# Patient Record
Sex: Male | Born: 1960 | Race: White | Hispanic: No | Marital: Married | Smoking: Never smoker
Health system: Southern US, Community
[De-identification: ages and names within clinical notes are randomized; demographics above are authoritative.]

## PROBLEM LIST (undated history)

## (undated) DIAGNOSIS — L719 Rosacea, unspecified: Secondary | ICD-10-CM

## (undated) DIAGNOSIS — Z973 Presence of spectacles and contact lenses: Secondary | ICD-10-CM

---
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
## 1982-06-03 HISTORY — PX: WISDOM TOOTH EXTRACTION: SHX21

## 2000-12-05 ENCOUNTER — Encounter: Payer: Self-pay | Admitting: Family Medicine

## 2000-12-05 ENCOUNTER — Ambulatory Visit (HOSPITAL_COMMUNITY): Admission: RE | Admit: 2000-12-05 | Discharge: 2000-12-05 | Payer: Self-pay | Admitting: Family Medicine

## 2009-06-03 HISTORY — PX: FOOT SURGERY: SHX648

## 2009-10-20 ENCOUNTER — Ambulatory Visit (HOSPITAL_COMMUNITY): Admission: RE | Admit: 2009-10-20 | Discharge: 2009-10-20 | Payer: Self-pay | Admitting: Family Medicine

## 2009-12-10 ENCOUNTER — Ambulatory Visit: Payer: Self-pay | Admitting: Surgery

## 2010-01-17 ENCOUNTER — Ambulatory Visit (HOSPITAL_COMMUNITY): Admission: RE | Admit: 2010-01-17 | Discharge: 2010-01-17 | Payer: Self-pay | Admitting: Orthopedic Surgery

## 2010-05-10 ENCOUNTER — Inpatient Hospital Stay (HOSPITAL_COMMUNITY): Admission: EM | Admit: 2010-05-10 | Discharge: 2009-12-11 | Payer: Self-pay | Admitting: Emergency Medicine

## 2010-12-11 IMAGING — CR DG CHEST 2V
2 series · 2 of 2 positions shown · non-contrast
Comparison: None.

CLINICAL DATA: Positive PPD.

CHEST - 2 VIEW

[view not recorded (1 of 2)]
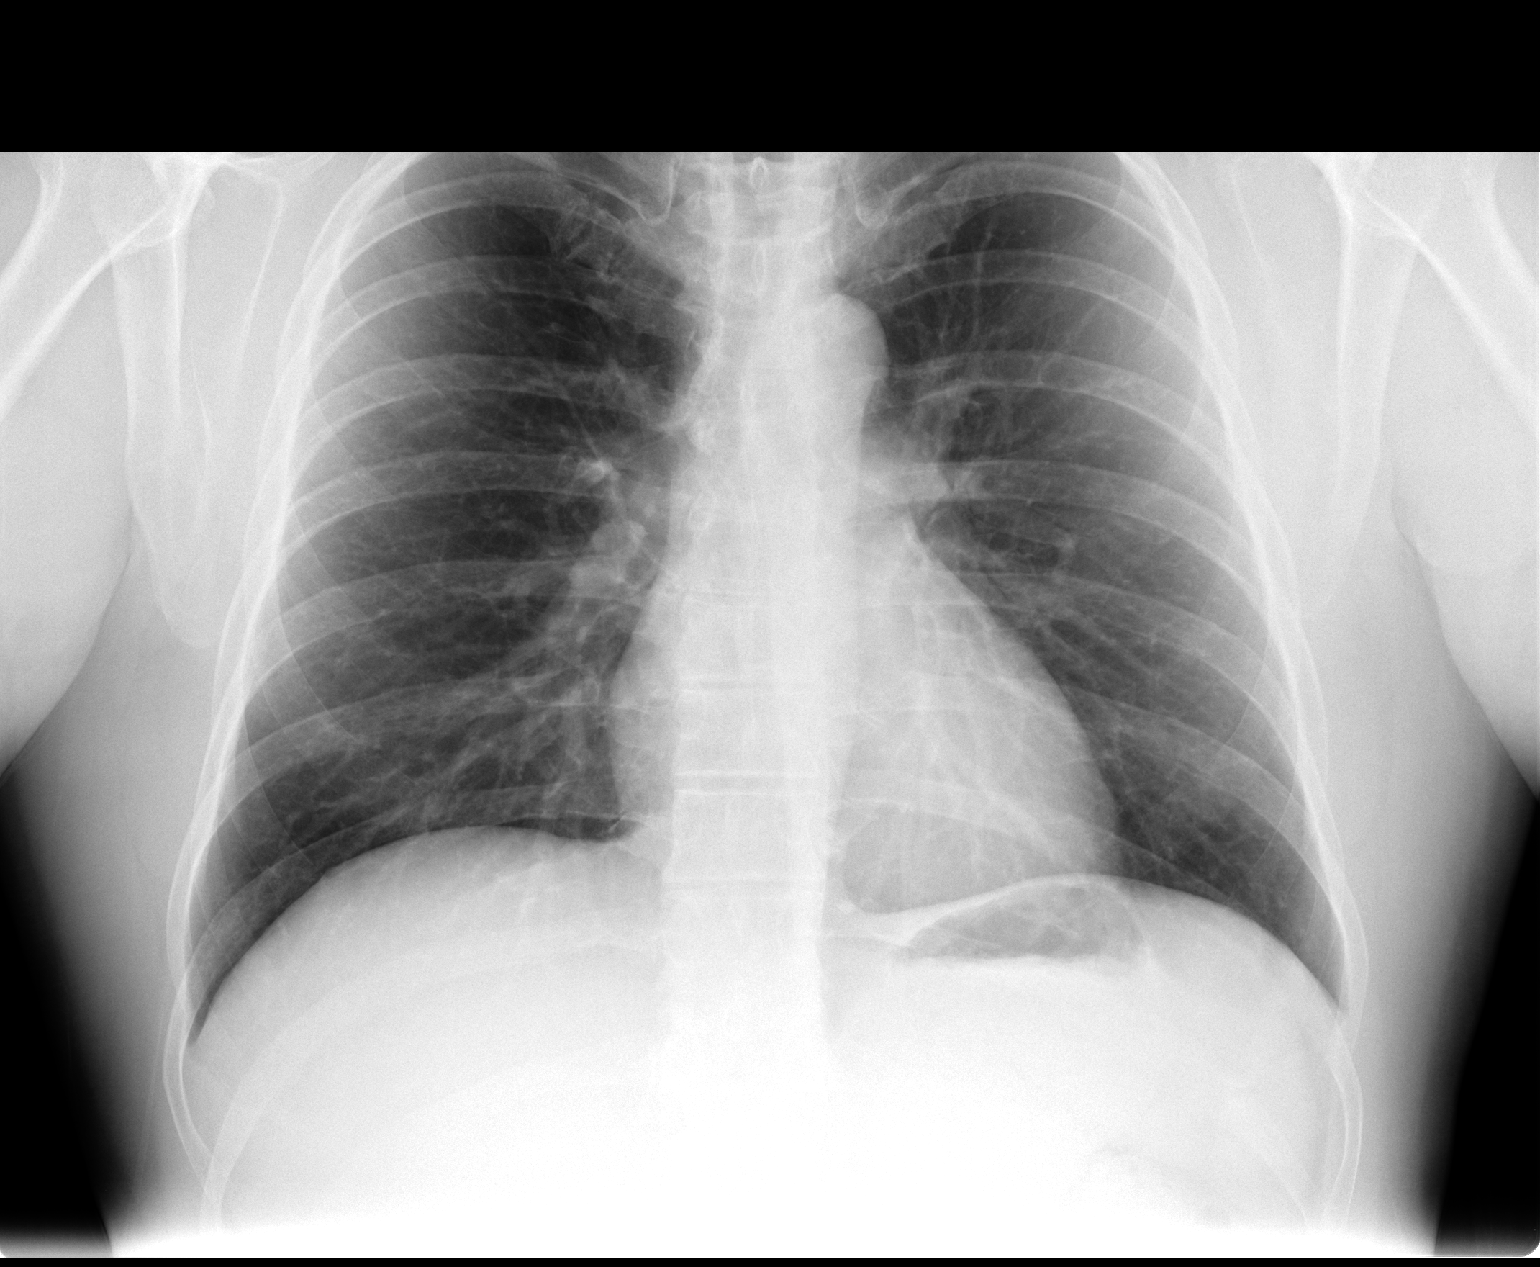

[view not recorded (2 of 2)]
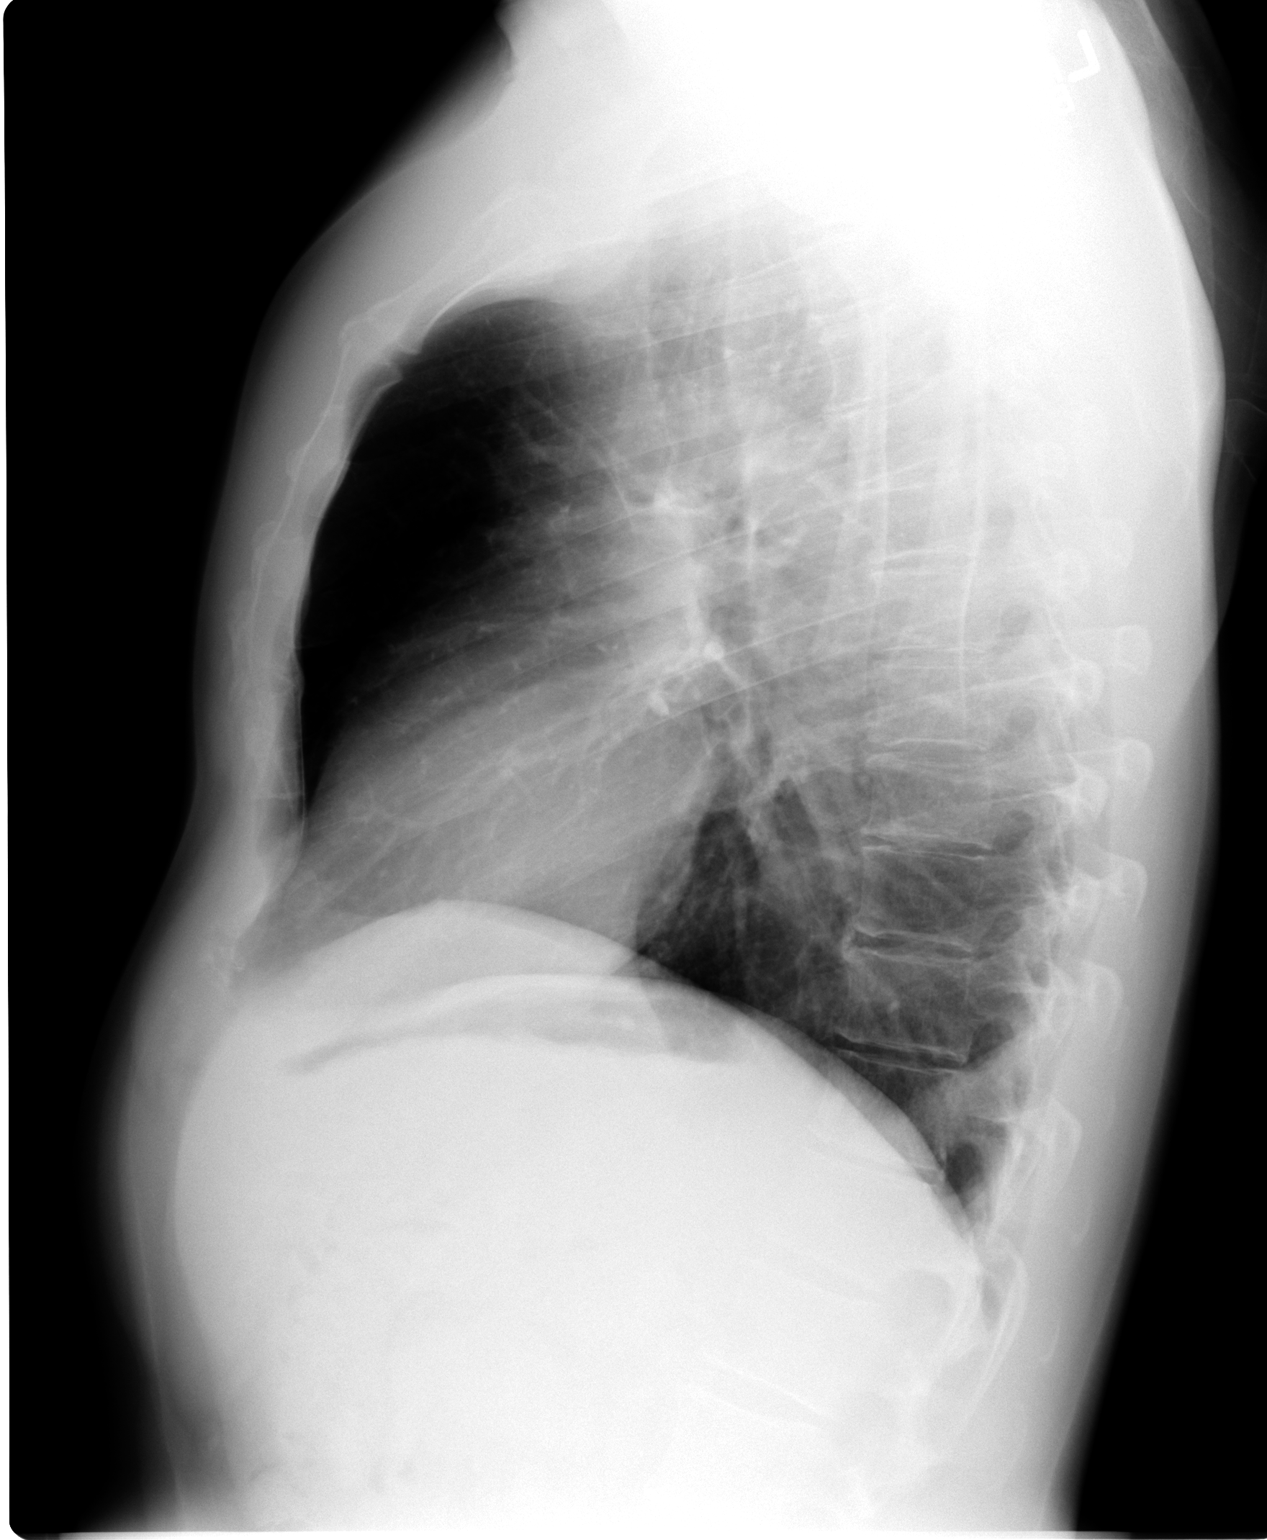

[2 of 2 positions shown; findings below may reference images not displayed]

FINDINGS: Two views of the chest demonstrate clear lungs.  There
may be some hyperinflation due to the increased retrosternal air.
Heart and mediastinum are within normal limits.  There is no focal
airspace disease.  Bony structures are intact.
IMPRESSION: No acute chest findings.  No evidence for acute or chronic TB.

## 2011-01-31 IMAGING — CR DG FOOT COMPLETE 3+V*L*
3 series · 3 of 3 positions shown · non-contrast
Comparison: None.

CLINICAL DATA: Dorsal laceration to the left foot; dropped glass
object on foot.

LEFT FOOT - COMPLETE 3+ VIEW

[view not recorded (1 of 3)]
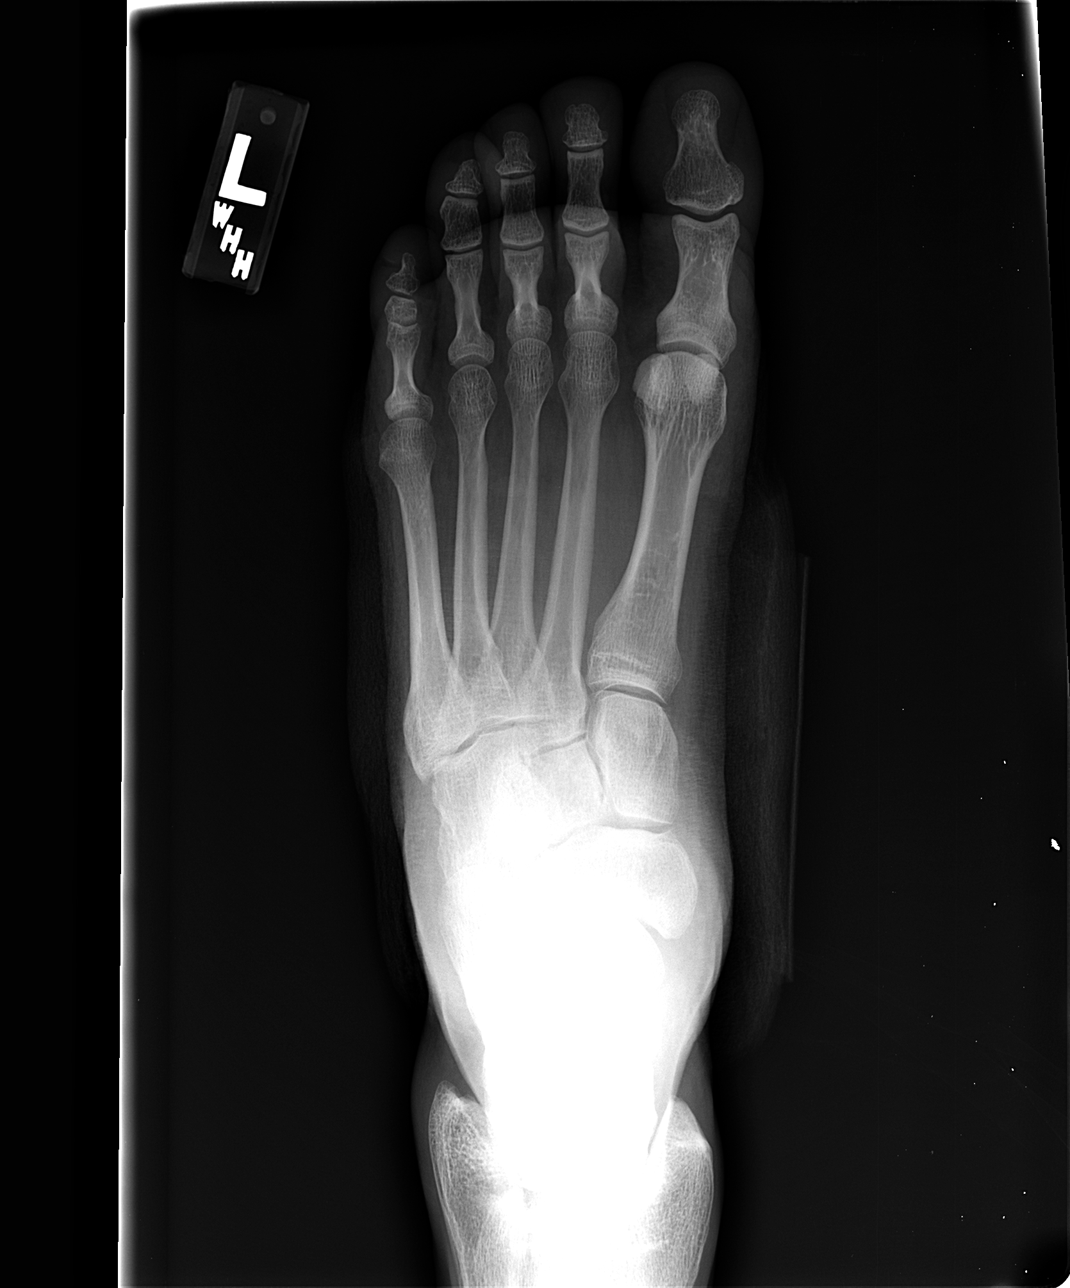

[view not recorded (2 of 3)]
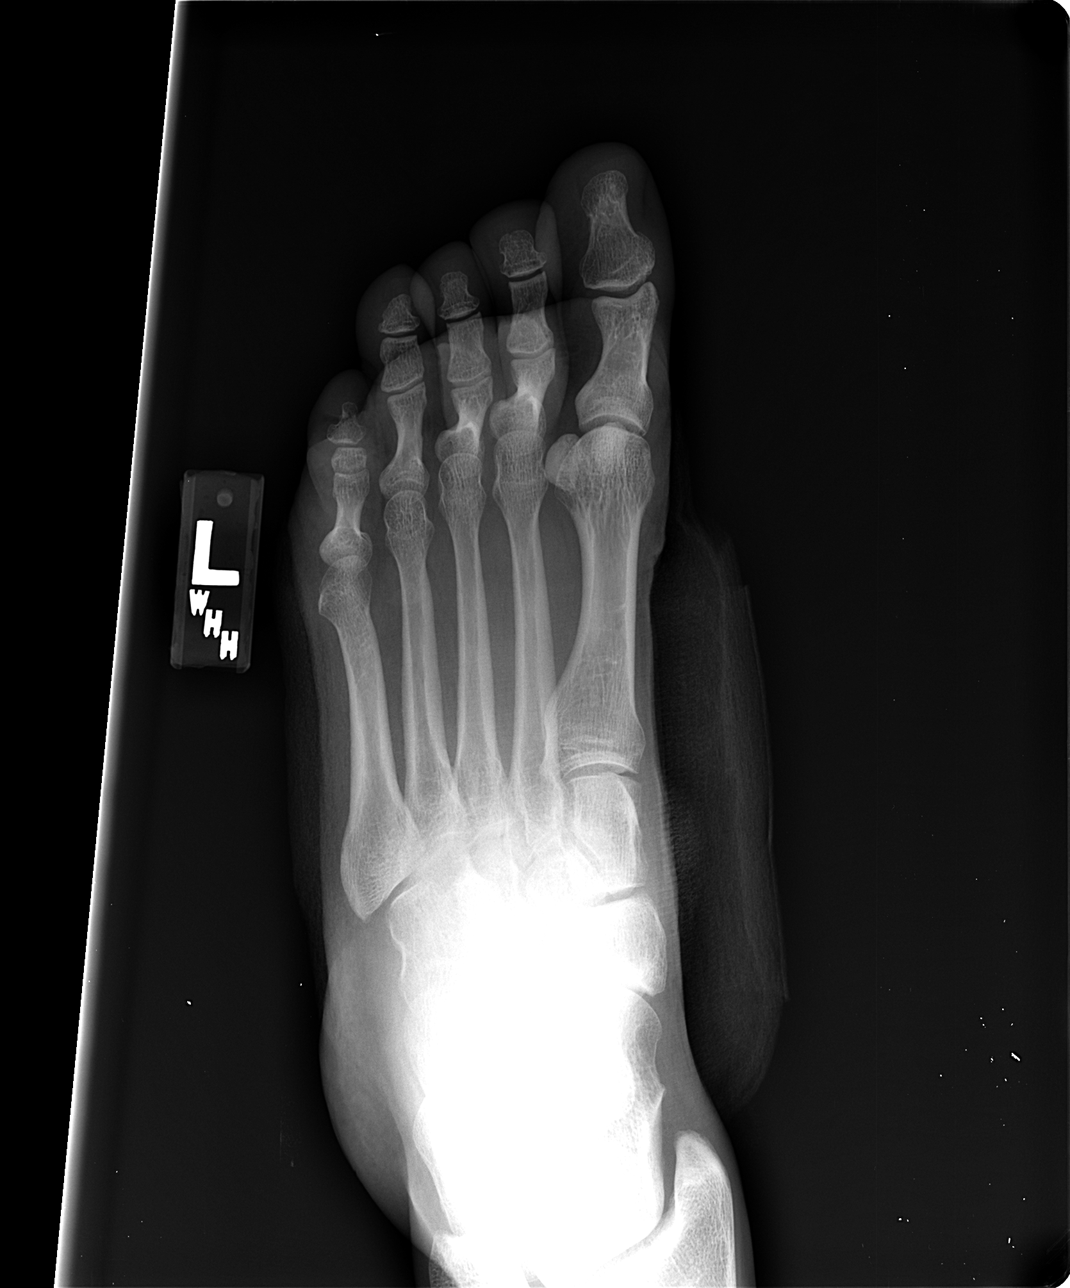

[view not recorded (3 of 3)]
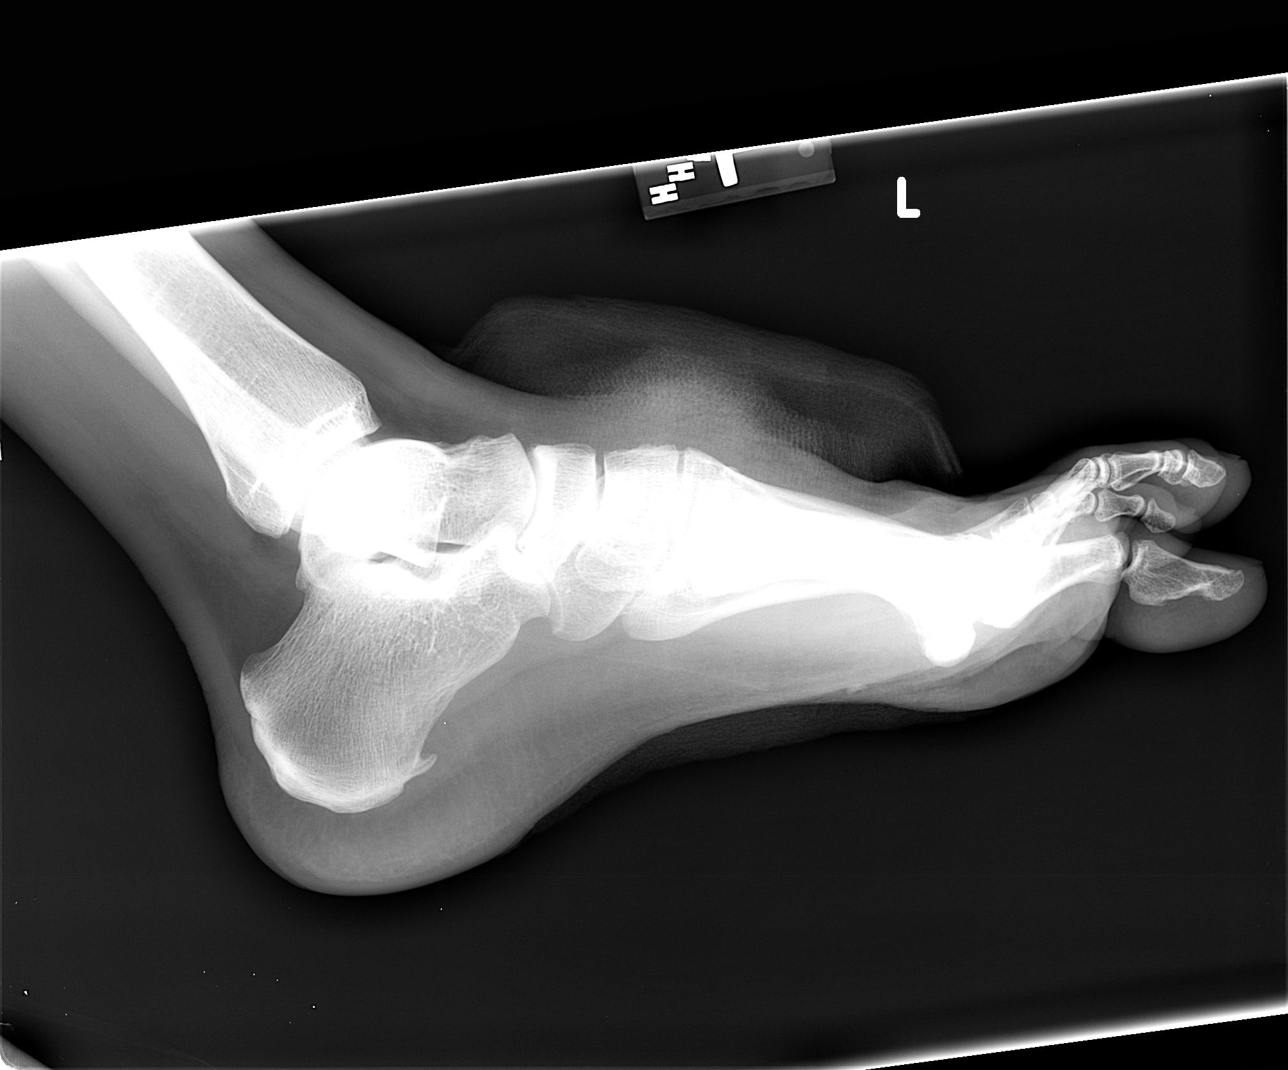

[3 of 3 positions shown; findings below may reference images not displayed]

FINDINGS: There is no evidence of fracture or dislocation.  No
radiopaque foreign body is identified.  The joint spaces are
preserved.  There is no evidence of talar subluxation; the subtalar
joint is unremarkable in appearance. A small plantar calcaneal spur
is incidentally noted.

Prominent soft tissue disruption is noted at the dorsum of the
midfoot, with an overlying bandage.
IMPRESSION: 1.  No evidence of fracture or dislocation. No radiopaque foreign
bodies seen.
2.  Prominent soft tissue disruption at the dorsum of the midfoot.

## 2011-06-04 HISTORY — PX: COLONOSCOPY: SHX174

## 2015-07-09 ENCOUNTER — Encounter: Payer: Self-pay | Admitting: Internal Medicine

## 2022-10-02 DIAGNOSIS — S86009A Unspecified injury of unspecified Achilles tendon, initial encounter: Secondary | ICD-10-CM

## 2022-10-02 HISTORY — DX: Unspecified injury of unspecified achilles tendon, initial encounter: S86.009A

## 2022-10-12 ENCOUNTER — Emergency Department (HOSPITAL_COMMUNITY): Payer: Commercial Managed Care - PPO

## 2022-10-12 ENCOUNTER — Emergency Department (HOSPITAL_COMMUNITY)
Admission: EM | Admit: 2022-10-12 | Discharge: 2022-10-12 | Disposition: A | Discharge status: Home / Self Care | Payer: Commercial Managed Care - PPO | Attending: Emergency Medicine | Admitting: Emergency Medicine

## 2022-10-12 ENCOUNTER — Other Ambulatory Visit: Payer: Self-pay

## 2022-10-12 DIAGNOSIS — M25571 Pain in right ankle and joints of right foot: Secondary | ICD-10-CM | POA: Diagnosis not present

## 2022-10-12 DIAGNOSIS — X501XXA Overexertion from prolonged static or awkward postures, initial encounter: Secondary | ICD-10-CM | POA: Diagnosis not present

## 2022-10-12 DIAGNOSIS — S86001A Unspecified injury of right Achilles tendon, initial encounter: Secondary | ICD-10-CM | POA: Diagnosis present

## 2022-10-12 NOTE — ED Triage Notes (Signed)
Pt via POV c/o right leg pain since earlier this morning. He was pushing a trailer and felt and heard a pop behind his right ankle; he thinks he may have damaged a tendon. Pain rated 4/10 at rest. Pt is able to walk but has a pronounced limp. No meds PTA.

## 2022-10-12 NOTE — ED Notes (Signed)
Splint placed on lower right foot in a 60 degree angle.

## 2022-10-12 NOTE — Discharge Instructions (Signed)
As discussed I agree that you have an Achilles tendon injury but I do not believe it is a full complete tear.  You will probably need MRI imaging for further evaluation.  In the interim you have been placed in a splint to protect your injury.  Use your home crutches as discussed.  I am referring you to Dr. Jena Gauss for further evaluation and management, call his office Monday morning.  In the interim I recommend ice and elevation is much as is comfortable.  Avoid weightbearing with your right leg.

## 2022-10-12 NOTE — ED Provider Notes (Signed)
[place] EMERGENCY DEPARTMENT AT [facility] Provider Note   CSN: [id number] Arrival date & time: 10/12/22  4098     History  Chief Complaint  Patient presents with   Leg Injury    Christian [name] is a 62 y.o. male presenting with sudden onset of snapping pain in his right posterior calf region when attempting to push a trailer forward just prior to arrival.  He can flex and extend the foot some, but with pain radiating to his lower gastrocnemius muscle.    The history is provided by the patient and the spouse.       Home Medications Prior to Admission medications   Not on File      Allergies    Penicillins    Review of Systems   Review of Systems  Constitutional:  Negative for fever.  Musculoskeletal:  Positive for arthralgias. Negative for joint swelling and myalgias.  Neurological:  Negative for weakness and numbness.  All other systems reviewed and are negative.   Physical Exam Updated Vital Signs BP (!) 146/96   Pulse 74   Temp 98.2 F (36.8 C) (Oral)   Resp 16   Ht 5\' 9"  (1.753 m)   Wt 75.8 kg   SpO2 100%   BMI 24.66 kg/m  Physical Exam Vitals and nursing note reviewed.  Constitutional:      Appearance: He is well-developed.  HENT:     Head: Normocephalic.  Cardiovascular:     Rate and Rhythm: Normal rate.     Pulses: Normal pulses. No decreased pulses.          Dorsalis pedis pulses are 2+ on the right side and 2+ on the left side.       Posterior tibial pulses are 2+ on the right side and 2+ on the left side.  Musculoskeletal:        General: Swelling and tenderness present. No deformity.     Right ankle: No swelling or ecchymosis. No tenderness. No proximal fibula tenderness. Normal range of motion. Normal pulse.     Right Achilles Tendon: Tenderness and defect present. Thompson's test negative.     Comments: Disruption felt about 5 cm above achilles insertion, but pt can flex/ext the foot some suggesting partial tear.  Skin:     General: Skin is warm and dry.     Findings: No lesion.  Neurological:     Mental Status: He is alert.     Sensory: No sensory deficit.     ED Results / Procedures / Treatments   Labs (all labs ordered are listed, but only abnormal results are displayed) Labs Reviewed - No data to display  EKG None  Radiology DG Ankle Complete Right  Result Date: 10/12/2022 CLINICAL DATA:  Right leg pain since earlier this morning. Patient heard a pop behind his right ankle. EXAM: RIGHT ANKLE - COMPLETE 3+ VIEW COMPARISON:  None Available. FINDINGS: There is no evidence of fracture, dislocation, or joint effusion. There is no evidence of arthropathy or other focal bone abnormality. Soft tissues are unremarkable. IMPRESSION: Negative. Electronically Signed   By: Emmaline Kluver M.D.   On: 10/12/2022 11:24    Procedures Procedures    SPLINT APPLICATION Date/Time: 1:23 PM Authorized by: Burgess Amor Consent: Verbal consent obtained. Risks and benefits: risks, benefits and alternatives were discussed Consent given by: patient Splint applied by: RN Location details: right leg Splint type: short leg posterior with foot in plantar flexion Supplies used: splint, webril, ace  wraps. Post-procedure: The splinted body part was neurovascularly unchanged following the procedure. Patient tolerance: Patient tolerated the procedure well with no immediate complications.    Medications Ordered in ED Medications - No data to display  ED Course/ Medical Decision Making/ A&P                             Medical Decision Making Pt with suspected partial achilles tear right.  Referral to ortho, splinting in place.  Pt has home crutches, discussed home tx, ice, nonweight bearing. Ortho early next week.    Amount and/or Complexity of Data Reviewed Radiology: ordered and independent interpretation performed.    Details: Reviewed, agree with reading  Risk OTC drugs.           Final Clinical  Impression(s) / ED Diagnoses Final diagnoses:  Injury of right Achilles tendon, initial encounter    Rx / DC Orders ED Discharge Orders     None         Victoriano Lain 10/12/22 1324    Arby Barrette, MD 10/14/22 1736

## 2022-10-14 ENCOUNTER — Other Ambulatory Visit: Payer: Self-pay

## 2022-10-14 ENCOUNTER — Encounter (HOSPITAL_BASED_OUTPATIENT_CLINIC_OR_DEPARTMENT_OTHER): Payer: Self-pay | Admitting: Orthopaedic Surgery

## 2022-10-14 NOTE — Anesthesia Preprocedure Evaluation (Signed)
Anesthesia Evaluation  Patient identified by MRN, date of birth, ID band Patient awake    Reviewed: Allergy & Precautions, H&P , NPO status , Patient's Chart, lab work & pertinent test results  Airway Mallampati: II  TM Distance: >3 FB Neck ROM: Full    Dental  (+) Teeth Intact, Dental Advisory Given   Pulmonary  Snores at night, has never had sleep study, no witnessed apneas   Pulmonary exam normal breath sounds clear to auscultation       Cardiovascular Exercise Tolerance: Good negative cardio ROS Normal cardiovascular exam Rhythm:Regular Rate:Normal     Neuro/Psych negative neurological ROS  negative psych ROS   GI/Hepatic negative GI ROS, Neg liver ROS,,,  Endo/Other  negative endocrine ROS    Renal/GU negative Renal ROS  negative genitourinary   Musculoskeletal negative musculoskeletal ROS (+)    Abdominal   Peds negative pediatric ROS (+)  Hematology negative hematology ROS (+)   Anesthesia Other Findings All: PCN  Reproductive/Obstetrics negative OB ROS                             Anesthesia Physical Anesthesia Plan  ASA: 1  Anesthesia Plan: General and Regional   Post-op Pain Management: Regional block*, Minimal or no pain anticipated, Tylenol PO (pre-op)* and Celebrex PO (pre-op)*   Induction: Intravenous  PONV Risk Score and Plan: 2 and Ondansetron, Dexamethasone, Treatment may vary due to age or medical condition and Midazolam  Airway Management Planned: Oral ETT  Additional Equipment: None  Intra-op Plan:   Post-operative Plan: Extubation in OR  Informed Consent: I have reviewed the patients History and Physical, chart, labs and discussed the procedure including the risks, benefits and alternatives for the proposed anesthesia with the patient or authorized representative who has indicated his/her understanding and acceptance.     Dental advisory given  Plan  Discussed with: Anesthesiologist and CRNA  Anesthesia Plan Comments: (R popliteal)       Anesthesia Quick Evaluation

## 2022-10-14 NOTE — Progress Notes (Signed)
Spoke w/ via phone for pre-op interview---Christian [name] needs dos---- none per anesthesia, surgeon orders pending              [name] results------none COVID test -----patient states asymptomatic no test needed Arrive at -------0815 on Tuesday, 10/15/22 NPO after MN NO Solid Food.  Clear liquids from MN until---0715 Med rec completed Medications to take morning of surgery -----none Diabetic medication -----n/a Patient instructed no nail polish to be worn day of surgery Patient instructed to bring photo id and insurance card day of surgery Patient aware to have Driver (ride ) / caregiver    for 24 hours after surgery - wife, Christian [name] Patient Special Instructions -----none Pre-Op special Instructions -----Patient is currently ambulating with crutches. Patient verbalized understanding of instructions that were given at this phone interview. Patient denies shortness of breath, chest pain, fever, cough at this phone interview.

## 2022-10-14 NOTE — H&P (Signed)
ORTHOPAEDIC SURGERY H&P  Subjective:  The patient presents with right Achilles rupture.   Past Medical History:  Diagnosis Date   Achilles tendon injury 10/2022   right   Rosacea    takes doxycycline prn   Wears glasses    readers only    Past Surgical History:  Procedure Laterality Date   COLONOSCOPY  2013   no polyps   FOOT SURGERY Left 2011   TONSILLECTOMY  1983   WISDOM TOOTH EXTRACTION  1984     (Not in an outpatient encounter)    Allergies  Allergen Reactions   Penicillins Other (See Comments)    Unsure of reaction.    Social History   Socioeconomic History   Marital status: Married    Spouse name: Not on file   Number of children: Not on file   Years of education: Not on file   Highest education level: Not on file  Occupational History   Not on file  Tobacco Use   Smoking status: Never   Smokeless tobacco: Never  Vaping Use   Vaping Use: Never used  Substance and Sexual Activity   Alcohol use: Yes    Alcohol/week: 6.0 standard drinks of alcohol    Types: 5 Glasses of wine, 1 Cans of beer per week   Drug use: Never   Sexual activity: Not on file  Other Topics Concern   Not on file  Social History Narrative   Not on file   Social Determinants of Health   Financial Resource Strain: Not on file  Food Insecurity: Not on file  Transportation Needs: Not on file  Physical Activity: Not on file  Stress: Not on file  Social Connections: Not on file  Intimate Partner Violence: Not on file     History reviewed. No pertinent family history.   Review of Systems Pertinent items are noted in HPI.  Objective: Vital signs in last 24 hours:    10/12/2022   12:00 PM 10/12/2022   11:30 AM 10/12/2022   11:04 AM  Vitals with BMI  Height   5\' 9"   Weight   167 lbs  BMI   24.65  Systolic 146 152 098  Diastolic 96 93 99  Pulse 74 65 62      EXAM: General: Well nourished, well developed. Awake, alert and oriented to time, place, person. Normal  mood and affect. No apparent distress. Breathing room air.  Operative Lower Extremity: Alignment - Neutral Deformity - None Skin intact Tenderness to palpation - none 0/5 TA, 5/5 PT, GS, Per, EHL, FHL Sensation intact to light touch throughout Palpable DP and PT pulses Special testing: None  The contralateral foot/ankle was examined for comparison and noted to be neurovascularly intact with no localized deformity, swelling, or tenderness.  Imaging Review All images taken were independently reviewed by me.  Assessment/Plan: The clinical and radiographic findings were reviewed and discussed at length with the patient.  The patient has right Achilles rupture.  We spoke at length about the natural course of these findings. We discussed nonoperative and operative treatment options in detail.  The risks and benefits were presented and reviewed. The risks due to rerupture, implant failure/irritation, infection, stiffness, nerve/vessel/tendon injury, wound healing issues, failure of this surgery, need for further surgery, thromboembolic events, amputation, death among others were discussed. The patient acknowledged the explanation and agreed to proceed with the plan.  Christian [name]  Orthopaedic Surgery [facility]

## 2022-10-14 NOTE — Discharge Instructions (Signed)
Allyna Pittsley, MD [facility]  Please read the following information regarding your care after surgery.  Medications  You only need a prescription for the narcotic pain medicine (ex. oxycodone, Percocet, Norco).  All of the other medicines listed below are available over the counter. ? Aleve 2 pills twice a day for the first 3 days after surgery. ? acetominophen (Tylenol) 650 mg every 4-6 hours as you need for minor to moderate pain ? oxycodone as prescribed for severe pain  ? To help prevent blood clots, take aspirin (81 mg) twice daily for 42 days after surgery (or total duration of nonweightbearing).  You should also get up every hour while you are awake to move around.  Weight Bearing ? Do NOT bear any weight on the operated leg or foot. This means do NOT touch your surgical leg to the ground!  Cast / Splint / Dressing ? If you have a splint, do NOT remove this. Keep your splint, cast or dressing clean and dry.  Don't put anything (coat hanger, pencil, etc) down inside of it.  If it gets wet, call the office immediately to schedule an appointment for a cast change.  Swelling IMPORTANT: It is normal for you to have swelling where you had surgery. To reduce swelling and pain, keep at least 3 pillows under your leg so that your toes are above your nose and your heel is above the level of your hip.  It may be necessary to keep your foot or leg elevated for several weeks.  This is critical to helping your incisions heal and your pain to feel better.  Follow Up Call my office at [phone] when you are discharged from the hospital or surgery center to schedule an appointment to be seen 7-10 days after surgery.  Call my office at [phone] if you develop a fever >101.5 F, nausea, vomiting, bleeding from the surgical site or severe pain.     Post Anesthesia Home Care Instructions  Activity: Get plenty of rest for the remainder of the day. A responsible individual must stay with  you for 24 hours following the procedure.  For the next 24 hours, DO NOT: -Drive a car -Operate machinery -Drink alcoholic beverages -Take any medication unless instructed by your physician -Make any legal decisions or sign important papers.  Meals: Start with liquid foods such as gelatin or soup. Progress to regular foods as tolerated. Avoid greasy, spicy, heavy foods. If nausea and/or vomiting occur, drink only clear liquids until the nausea and/or vomiting subsides. Call your physician if vomiting continues.  Special Instructions/Symptoms: Your throat may feel dry or sore from the anesthesia or the breathing tube placed in your throat during surgery. If this causes discomfort, gargle with warm salt water. The discomfort should disappear within 24 hours.  If you had a scopolamine patch placed behind your ear for the management of post- operative nausea and/or vomiting:  1. The medication in the patch is effective for 72 hours, after which it should be removed.  Wrap patch in a tissue and discard in the trash. Wash hands thoroughly with soap and water. 2. You may remove the patch earlier than 72 hours if you experience unpleasant side effects which may include dry mouth, dizziness or visual disturbances. 3. Avoid touching the patch. Wash your hands with soap and water after contact with the patch.    Post Anesthesia Home Care Instructions  Activity: Get plenty of rest for the remainder of the day. A responsible individual must stay   with you for 24 hours following the procedure.  For the next 24 hours, DO NOT: -Drive a car -Operate machinery -Drink alcoholic beverages -Take any medication unless instructed by your physician -Make any legal decisions or sign important papers.  Meals: Start with liquid foods such as gelatin or soup. Progress to regular foods as tolerated. Avoid greasy, spicy, heavy foods. If nausea and/or vomiting occur, drink only clear liquids until the nausea and/or  vomiting subsides. Call your physician if vomiting continues.  Special Instructions/Symptoms: Your throat may feel dry or sore from the anesthesia or the breathing tube placed in your throat during surgery. If this causes discomfort, gargle with warm salt water. The discomfort should disappear within 24 hours.  If you had a scopolamine patch placed behind your ear for the management of post- operative nausea and/or vomiting:  1. The medication in the patch is effective for 72 hours, after which it should be removed.  Wrap patch in a tissue and discard in the trash. Wash hands thoroughly with soap and water. 2. You may remove the patch earlier than 72 hours if you experience unpleasant side effects which may include dry mouth, dizziness or visual disturbances. 3. Avoid touching the patch. Wash your hands with soap and water after contact with the patch.    Post Anesthesia Home Care Instructions  Activity: Get plenty of rest for the remainder of the day. A responsible individual must stay with you for 24 hours following the procedure.  For the next 24 hours, DO NOT: -Drive a car -Operate machinery -Drink alcoholic beverages -Take any medication unless instructed by your physician -Make any legal decisions or sign important papers.  Meals: Start with liquid foods such as gelatin or soup. Progress to regular foods as tolerated. Avoid greasy, spicy, heavy foods. If nausea and/or vomiting occur, drink only clear liquids until the nausea and/or vomiting subsides. Call your physician if vomiting continues.  Special Instructions/Symptoms: Your throat may feel dry or sore from the anesthesia or the breathing tube placed in your throat during surgery. If this causes discomfort, gargle with warm salt water. The discomfort should disappear within 24 hours.  If you had a scopolamine patch placed behind your ear for the management of post- operative nausea and/or vomiting:  1. The medication in the  patch is effective for 72 hours, after which it should be removed.  Wrap patch in a tissue and discard in the trash. Wash hands thoroughly with soap and water. 2. You may remove the patch earlier than 72 hours if you experience unpleasant side effects which may include dry mouth, dizziness or visual disturbances. 3. Avoid touching the patch. Wash your hands with soap and water after contact with the patch.     Regional Anesthesia Blocks  1. Numbness or the inability to move the "blocked" extremity may last from 3-48 hours after placement. The length of time depends on the medication injected and your individual response to the medication. If the numbness is not going away after 48 hours, call your surgeon.  2. The extremity that is blocked will need to be protected until the numbness is gone and the  Strength has returned. Because you cannot feel it, you will need to take extra care to avoid injury. Because it may be weak, you may have difficulty moving it or using it. You may not know what position it is in without looking at it while the block is in effect.  3. For blocks in the legs and   feet, returning to weight bearing and walking needs to be done carefully. You will need to wait until the numbness is entirely gone and the strength has returned. You should be able to move your leg and foot normally before you try and bear weight or walk. You will need someone to be with you when you first try to ensure you do not fall and possibly risk injury.  4. Bruising and tenderness at the needle site are common side effects and will resolve in a few days.  5. Persistent numbness or new problems with movement should be communicated to the surgeon or the [facility] ([phone])/ [facility] ([phone]). 

## 2022-10-15 ENCOUNTER — Encounter (HOSPITAL_BASED_OUTPATIENT_CLINIC_OR_DEPARTMENT_OTHER)
Admission: RE | Disposition: A | Discharge status: Home / Self Care | Payer: Self-pay | Source: Home / Self Care | Attending: Orthopaedic Surgery

## 2022-10-15 ENCOUNTER — Encounter (HOSPITAL_COMMUNITY): Payer: Self-pay

## 2022-10-15 ENCOUNTER — Ambulatory Visit (HOSPITAL_BASED_OUTPATIENT_CLINIC_OR_DEPARTMENT_OTHER): Payer: Commercial Managed Care - PPO

## 2022-10-15 ENCOUNTER — Ambulatory Visit (HOSPITAL_BASED_OUTPATIENT_CLINIC_OR_DEPARTMENT_OTHER)
Admission: RE | Admit: 2022-10-15 | Discharge: 2022-10-15 | Disposition: A | Discharge status: Home / Self Care | Payer: Commercial Managed Care - PPO | Attending: Orthopaedic Surgery | Admitting: Orthopaedic Surgery

## 2022-10-15 ENCOUNTER — Encounter (HOSPITAL_BASED_OUTPATIENT_CLINIC_OR_DEPARTMENT_OTHER): Payer: Self-pay | Admitting: Orthopaedic Surgery

## 2022-10-15 DIAGNOSIS — Z01818 Encounter for other preprocedural examination: Secondary | ICD-10-CM

## 2022-10-15 DIAGNOSIS — Z5309 Procedure and treatment not carried out because of other contraindication: Secondary | ICD-10-CM | POA: Insufficient documentation

## 2022-10-15 DIAGNOSIS — Y939 Activity, unspecified: Secondary | ICD-10-CM | POA: Insufficient documentation

## 2022-10-15 DIAGNOSIS — S86011A Strain of right Achilles tendon, initial encounter: Secondary | ICD-10-CM | POA: Insufficient documentation

## 2022-10-15 DIAGNOSIS — X58XXXA Exposure to other specified factors, initial encounter: Secondary | ICD-10-CM | POA: Insufficient documentation

## 2022-10-15 HISTORY — DX: Rosacea, unspecified: L71.9

## 2022-10-15 HISTORY — DX: Presence of spectacles and contact lenses: Z97.3

## 2022-10-15 LAB — SURGICAL PCR SCREEN
MRSA, PCR: NEGATIVE
Staphylococcus aureus: NEGATIVE

## 2022-10-15 SURGERY — REPAIR, TENDON, ACHILLES
Anesthesia: General | Laterality: Right

## 2022-10-15 MED ORDER — FENTANYL CITRATE (PF) 100 MCG/2ML IJ SOLN
INTRAMUSCULAR | Status: AC
  Filled 2022-10-15: qty 2

## 2022-10-15 MED ORDER — CEFAZOLIN SODIUM-DEXTROSE 2-4 GM/100ML-% IV SOLN
INTRAVENOUS | Status: AC
  Filled 2022-10-15: qty 100

## 2022-10-15 MED ORDER — ROCURONIUM BROMIDE 10 MG/ML (PF) SYRINGE
PREFILLED_SYRINGE | INTRAVENOUS | Status: AC
  Filled 2022-10-15: qty 10

## 2022-10-15 MED ORDER — CELECOXIB 200 MG PO CAPS
200.0000 mg | ORAL_CAPSULE | Freq: Once | ORAL | Status: AC
  Administered 2022-10-15: 200 mg via ORAL

## 2022-10-15 MED ORDER — MIDAZOLAM HCL 2 MG/2ML IJ SOLN
INTRAMUSCULAR | Status: AC
  Filled 2022-10-15: qty 2

## 2022-10-15 MED ORDER — BUPIVACAINE LIPOSOME 1.3 % IJ SUSP
INTRAMUSCULAR | Status: AC
  Filled 2022-10-15: qty 10

## 2022-10-15 MED ORDER — ACETAMINOPHEN 500 MG PO TABS
ORAL_TABLET | ORAL | Status: AC
  Filled 2022-10-15: qty 2

## 2022-10-15 MED ORDER — CELECOXIB 200 MG PO CAPS
ORAL_CAPSULE | ORAL | Status: AC
  Filled 2022-10-15: qty 1

## 2022-10-15 MED ORDER — ACETAMINOPHEN 500 MG PO TABS
1000.0000 mg | ORAL_TABLET | Freq: Once | ORAL | Status: AC
  Administered 2022-10-15: 1000 mg via ORAL

## 2022-10-15 MED ORDER — LIDOCAINE HCL (PF) 2 % IJ SOLN
INTRAMUSCULAR | Status: AC
  Filled 2022-10-15: qty 5

## 2022-10-15 MED ORDER — LACTATED RINGERS IV SOLN: INTRAVENOUS | Status: DC

## 2022-10-15 MED ORDER — PROPOFOL 10 MG/ML IV BOLUS
INTRAVENOUS | Status: AC
  Filled 2022-10-15: qty 20

## 2022-10-15 MED ORDER — CEFAZOLIN SODIUM-DEXTROSE 2-4 GM/100ML-% IV SOLN: 2.0000 g | INTRAVENOUS | Status: DC

## 2022-10-15 MED ORDER — ONDANSETRON HCL 4 MG/2ML IJ SOLN
INTRAMUSCULAR | Status: AC
  Filled 2022-10-15: qty 2

## 2022-10-15 MED ORDER — CHLORHEXIDINE GLUCONATE 4 % EX SOLN: 60.0000 mL | Freq: Once | CUTANEOUS | Status: DC

## 2022-10-15 SURGICAL SUPPLY — 73 items
APL PRP STRL LF DISP 70% ISPRP (MISCELLANEOUS)
BANDAGE ESMARK 6X9 LF (GAUZE/BANDAGES/DRESSINGS) IMPLANT
BLADE AVERAGE 25X9 (BLADE) IMPLANT
BLADE MICRO SAGITTAL (BLADE) IMPLANT
BLADE SURG 15 STRL LF DISP TIS (BLADE) IMPLANT
BLADE SURG 15 STRL SS (BLADE)
BNDG CMPR 5X62 HK CLSR LF (GAUZE/BANDAGES/DRESSINGS)
BNDG CMPR 9X6 STRL LF SNTH (GAUZE/BANDAGES/DRESSINGS)
BNDG COHESIVE 4X5 TAN STRL LF (GAUZE/BANDAGES/DRESSINGS) IMPLANT
BNDG ELASTIC 4INX 5YD STR LF (GAUZE/BANDAGES/DRESSINGS) IMPLANT
BNDG ELASTIC 6INX 5YD STR LF (GAUZE/BANDAGES/DRESSINGS) IMPLANT
BNDG ESMARK 6X9 LF (GAUZE/BANDAGES/DRESSINGS)
BNDG GAUZE DERMACEA FLUFF 4 (GAUZE/BANDAGES/DRESSINGS) IMPLANT
BNDG GZE DERMACEA 4 6PLY (GAUZE/BANDAGES/DRESSINGS)
CHLORAPREP W/TINT 26 (MISCELLANEOUS) IMPLANT
COVER BACK TABLE 60X90IN (DRAPES) IMPLANT
CUFF TOURN SGL QUICK 34 (TOURNIQUET CUFF)
CUFF TRNQT CYL 34X4.125X (TOURNIQUET CUFF) IMPLANT
DRAPE EXTREMITY T 121X128X90 (DISPOSABLE) IMPLANT
DRAPE IMP U-DRAPE 54X76 (DRAPES) IMPLANT
DRAPE OEC MINIVIEW 54X84 (DRAPES) IMPLANT
DRAPE U-SHAPE 47X51 STRL (DRAPES) IMPLANT
DRSG MEPITEL 4X7.2 (GAUZE/BANDAGES/DRESSINGS) IMPLANT
ELECT REM PT RETURN 9FT ADLT (ELECTROSURGICAL)
ELECTRODE REM PT RTRN 9FT ADLT (ELECTROSURGICAL) IMPLANT
GAUZE PAD ABD 8X10 STRL (GAUZE/BANDAGES/DRESSINGS) IMPLANT
GAUZE SPONGE 4X4 12PLY STRL (GAUZE/BANDAGES/DRESSINGS) IMPLANT
GLOVE BIOGEL PI IND STRL 8 (GLOVE) IMPLANT
GLOVE SURG SS PI 7.5 STRL IVOR (GLOVE) IMPLANT
GOWN STRL REUS W/ TWL LRG LVL3 (GOWN DISPOSABLE) IMPLANT
GOWN STRL REUS W/TWL LRG LVL3 (GOWN DISPOSABLE)
NDL SUT 6 .5 CRC .975X.05 MAYO (NEEDLE) IMPLANT
NEEDLE HYPO 25X1 1.5 SAFETY (NEEDLE) IMPLANT
NEEDLE MAYO TAPER (NEEDLE)
NS IRRIG 500ML POUR BTL (IV SOLUTION) IMPLANT
PACK BASIN DAY SURGERY FS (CUSTOM PROCEDURE TRAY) IMPLANT
PAD CAST 4YDX4 CTTN HI CHSV (CAST SUPPLIES) IMPLANT
PADDING CAST ABS COTTON 4X4 ST (CAST SUPPLIES) IMPLANT
PADDING CAST COTTON 4X4 STRL (CAST SUPPLIES)
PADDING CAST SYNTHETIC 4X4 STR (CAST SUPPLIES) IMPLANT
PENCIL SMOKE EVACUATOR (MISCELLANEOUS) IMPLANT
SANITIZER HAND PURELL FF 515ML (MISCELLANEOUS) IMPLANT
SHEET MEDIUM DRAPE 40X70 STRL (DRAPES) IMPLANT
SLEEVE SCD COMPRESS KNEE MED (STOCKING) IMPLANT
SPLINT FIBERGLASS 4X30 (CAST SUPPLIES) IMPLANT
SPLINT PLASTER CAST FAST 5X30 (CAST SUPPLIES) IMPLANT
SPONGE T-LAP 18X18 ~~LOC~~+RFID (SPONGE) IMPLANT
STOCKINETTE 6  STRL (DRAPES)
STOCKINETTE 6 STRL (DRAPES) IMPLANT
SUCTION FRAZIER HANDLE 10FR (MISCELLANEOUS)
SUCTION TUBE FRAZIER 10FR DISP (MISCELLANEOUS) IMPLANT
SUT ETHILON 2 0 FS 18 (SUTURE) IMPLANT
SUT FIBERWIRE #2 38 T-5 BLUE (SUTURE)
SUT MNCRL AB 3-0 PS2 18 (SUTURE) IMPLANT
SUT VIC AB 0 CT1 27 (SUTURE)
SUT VIC AB 0 CT1 27XBRD ANBCTR (SUTURE) IMPLANT
SUT VIC AB 0 SH 27 (SUTURE) IMPLANT
SUT VIC AB 1 CT1 27 (SUTURE)
SUT VIC AB 1 CT1 27XBRD ANBCTR (SUTURE) IMPLANT
SUT VIC AB 2-0 SH 27 (SUTURE)
SUT VIC AB 2-0 SH 27XBRD (SUTURE) IMPLANT
SUT VIC AB 3-0 SH 27 (SUTURE)
SUT VIC AB 3-0 SH 27X BRD (SUTURE) IMPLANT
SUTURE FIBERWR #2 38 T-5 BLUE (SUTURE) IMPLANT
SUTURE TAPE 1.3 FIBERLOP 20 ST (SUTURE) IMPLANT
SUTURETAPE 1.3 FIBERLOOP 20 ST (SUTURE)
SYR BULB EAR ULCER 3OZ GRN STR (SYRINGE) IMPLANT
SYR CONTROL 10ML LL (SYRINGE) IMPLANT
TOWEL GREEN STERILE FF (TOWEL DISPOSABLE) IMPLANT
TOWEL OR 17X24 6PK STRL BLUE (TOWEL DISPOSABLE) IMPLANT
TUBE CONNECTING 12X1/4 (SUCTIONS) IMPLANT
UNDERPAD 30X36 HEAVY ABSORB (UNDERPADS AND DIAPERS) IMPLANT
YANKAUER SUCT BULB TIP NO VENT (SUCTIONS) IMPLANT

## 2022-10-15 NOTE — Patient Instructions (Addendum)
SURGICAL WAITING ROOM VISITATION Patients having surgery or a procedure may have no more than 2 support people in the waiting area - these visitors may rotate.    Children under the age of 64 must have an adult with them who is not the patient.  If the patient needs to stay at the hospital during part of their recovery, the visitor guidelines for inpatient rooms apply. Pre-op nurse will coordinate an appropriate time for 1 support person to accompany patient in pre-op.  This support person may not rotate.    Please refer to the [facility] website for the visitor guidelines for Inpatients (after your surgery is over and you are in a regular room).       Your procedure is scheduled on:  10-16-22   Report to [facility] Main Entrance    Report to admitting at 1:15 PM   Call this number if you have problems the morning of surgery [phone]   Do not eat food :After Midnight.   After Midnight you may have the following liquids until 12:30 PM DAY OF SURGERY  Water Non-Citrus Juices (without pulp, NO RED-Apple, White grape, White cranberry) Black Coffee (NO MILK/CREAM OR CREAMERS, sugar ok)  Clear Tea (NO MILK/CREAM OR CREAMERS, sugar ok) regular and decaf                             Plain Jell-O (NO RED)                                           Fruit ices (not with fruit pulp, NO RED)                                     Popsicles (NO RED)                                                               Sports drinks like Gatorade (NO RED)                    If you have questions, please contact your surgeon's office.   FOLLOW  ANY ADDITIONAL PRE OP INSTRUCTIONS YOU RECEIVED FROM YOUR SURGEON'S OFFICE!!!     Oral Hygiene is also important to reduce your risk of infection.                                    Remember - BRUSH YOUR TEETH THE MORNING OF SURGERY WITH YOUR REGULAR TOOTHPASTE   Do NOT smoke after Midnight   Take these medicines the morning of surgery with A SIP  OF WATER:  None                              You may not have any metal on your body including  jewelry, and body piercing             Do not wear lotions, powders,  cologne, or deodorant              Men may shave face and neck.   Do not bring valuables to the hospital. [facility] IS NOT RESPONSIBLE   FOR VALUABLES.   Contacts, dentures or bridgework may not be worn into surgery.  DO NOT BRING YOUR HOME MEDICATIONS TO THE HOSPITAL. PHARMACY WILL DISPENSE MEDICATIONS LISTED ON YOUR MEDICATION LIST TO YOU DURING YOUR ADMISSION IN THE HOSPITAL!    Patients discharged on the day of surgery will not be allowed to drive home.  Someone NEEDS to stay with you for the first 24 hours after anesthesia.   Special Instructions: Bring a copy of your healthcare power of attorney and living will documents the day of surgery if you haven't scanned them before.              Please read over the following fact sheets you were given: IF YOU HAVE QUESTIONS ABOUT YOUR PRE-OP INSTRUCTIONS PLEASE CALL [phone]   If you received a COVID test during your pre-op visit  it is requested that you wear a mask when out in public, stay away from anyone that may not be feeling well and notify your surgeon if you develop symptoms. If you test positive for Covid or have been in contact with anyone that has tested positive in the last 10 days please notify you surgeon.   - Preparing for Surgery (Use Antibacterial Soap if you do not have CHG) Before surgery, you can play an important role.  Because skin is not sterile, your skin needs to be as free of germs as possible.  You can reduce the number of germs on your skin by washing with CHG (chlorahexidine gluconate) soap before surgery.  CHG is an antiseptic cleaner which kills germs and bonds with the skin to continue killing germs even after washing. Please DO NOT use if you have an allergy to CHG or antibacterial soaps.  If your skin becomes reddened/irritated  stop using the CHG and inform your nurse when you arrive at Short Stay. Do not shave (including legs and underarms) for at least 48 hours prior to the first CHG shower.  You may shave your face/neck.  Please follow these instructions carefully:  1.  Shower with CHG Soap the night before surgery and the  morning of surgery.  2.  If you choose to wash your hair, wash your hair first as usual with your normal  shampoo.  3.  After you shampoo, rinse your hair and body thoroughly to remove the shampoo.                             4.  Use CHG as you would any other liquid soap.  You can apply chg directly to the skin and wash.  Gently with a scrungie or clean washcloth.  5.  Apply the CHG Soap to your body ONLY FROM THE NECK DOWN.   Do   not use on face/ open                           Wound or open sores. Avoid contact with eyes, ears mouth and   genitals (private parts).                       Wash face,  Genitals (private parts) with your normal soap.  6.  Wash thoroughly, paying special attention to the area where your    surgery  will be performed.  7.  Thoroughly rinse your body with warm water from the neck down.  8.  DO NOT shower/wash with your normal soap after using and rinsing off the CHG Soap.                9.  Pat yourself dry with a clean towel.            10.  Wear clean pajamas.            11.  Place clean sheets on your bed the night of your first shower and do not  sleep with pets. Day of Surgery : Do not apply any lotions/deodorants the morning of surgery.  Please wear clean clothes to the hospital/surgery center.  FAILURE TO FOLLOW THESE INSTRUCTIONS MAY RESULT IN THE CANCELLATION OF YOUR SURGERY  PATIENT SIGNATURE_________________________________  NURSE SIGNATURE__________________________________  ________________________________________________________________________

## 2022-10-15 NOTE — Progress Notes (Signed)
Surgery orders requested via Epic inbox. °

## 2022-10-15 NOTE — Progress Notes (Signed)
Procedure cancelled due to insurance issues per Dr. Odis Hollingshead. Patient discharged in stable condition  at 66 with Wife Teressa.

## 2022-10-15 NOTE — Progress Notes (Signed)
Patient phoned to give updated information on surgery.  Surgery moved from [facility] to [facility] main due to insurance reasons.  Date of Surgery - 10-16-22  Arrival Time - check in at admitting at 1:15 PM    NPO Status - patient reminded to not eat solid food after midnight.  From midnight until 12:30 PM may have clear liquids  Medications morning of surgery - None  No change in medical history, allergies per patient.  Transportation home - patient's wife Christian [name].    All questions answered and patient stated understanding

## 2022-10-16 ENCOUNTER — Ambulatory Visit (HOSPITAL_BASED_OUTPATIENT_CLINIC_OR_DEPARTMENT_OTHER): Payer: Commercial Managed Care - PPO | Admitting: Anesthesiology

## 2022-10-16 ENCOUNTER — Encounter (HOSPITAL_COMMUNITY)
Admission: RE | Disposition: A | Discharge status: Home / Self Care | Payer: Self-pay | Source: Home / Self Care | Attending: Orthopaedic Surgery

## 2022-10-16 ENCOUNTER — Ambulatory Visit (HOSPITAL_COMMUNITY): Payer: Commercial Managed Care - PPO | Admitting: Anesthesiology

## 2022-10-16 ENCOUNTER — Encounter (HOSPITAL_COMMUNITY): Payer: Self-pay | Admitting: Orthopaedic Surgery

## 2022-10-16 ENCOUNTER — Other Ambulatory Visit: Payer: Self-pay

## 2022-10-16 ENCOUNTER — Ambulatory Visit (HOSPITAL_COMMUNITY)
Admission: RE | Admit: 2022-10-16 | Discharge: 2022-10-16 | Disposition: A | Discharge status: Home / Self Care | Payer: Commercial Managed Care - PPO | Attending: Orthopaedic Surgery | Admitting: Orthopaedic Surgery

## 2022-10-16 DIAGNOSIS — S86011D Strain of right Achilles tendon, subsequent encounter: Secondary | ICD-10-CM | POA: Diagnosis not present

## 2022-10-16 DIAGNOSIS — S86011A Strain of right Achilles tendon, initial encounter: Secondary | ICD-10-CM | POA: Diagnosis not present

## 2022-10-16 DIAGNOSIS — X509XXA Other and unspecified overexertion or strenuous movements or postures, initial encounter: Secondary | ICD-10-CM | POA: Diagnosis not present

## 2022-10-16 HISTORY — PX: ACHILLES TENDON SURGERY: SHX542

## 2022-10-16 SURGERY — REPAIR, TENDON, ACHILLES
Anesthesia: Regional | Laterality: Right

## 2022-10-16 MED ORDER — HYDROMORPHONE HCL 1 MG/ML IJ SOLN: 0.2500 mg | INTRAMUSCULAR | Status: DC | PRN

## 2022-10-16 MED ORDER — CEFAZOLIN SODIUM-DEXTROSE 2-4 GM/100ML-% IV SOLN
2.0000 g | INTRAVENOUS | Status: AC
  Administered 2022-10-16: 2 g via INTRAVENOUS

## 2022-10-16 MED ORDER — PROPOFOL 10 MG/ML IV BOLUS
INTRAVENOUS | Status: DC | PRN
  Administered 2022-10-16: 200 mg via INTRAVENOUS

## 2022-10-16 MED ORDER — VANCOMYCIN HCL 500 MG IV SOLR
INTRAVENOUS | Status: DC | PRN
  Administered 2022-10-16: 1000 mg via TOPICAL

## 2022-10-16 MED ORDER — FENTANYL CITRATE PF 50 MCG/ML IJ SOSY
PREFILLED_SYRINGE | INTRAMUSCULAR | Status: AC
  Filled 2022-10-16: qty 2

## 2022-10-16 MED ORDER — LIDOCAINE 2% (20 MG/ML) 5 ML SYRINGE
INTRAMUSCULAR | Status: DC | PRN
  Administered 2022-10-16: 60 mg via INTRAVENOUS

## 2022-10-16 MED ORDER — LACTATED RINGERS IV SOLN: INTRAVENOUS | Status: DC

## 2022-10-16 MED ORDER — MIDAZOLAM HCL 5 MG/5ML IJ SOLN
INTRAMUSCULAR | Status: DC | PRN
  Administered 2022-10-16: 2 mg via INTRAVENOUS

## 2022-10-16 MED ORDER — ACETAMINOPHEN 10 MG/ML IV SOLN
INTRAVENOUS | Status: AC
  Filled 2022-10-16: qty 100

## 2022-10-16 MED ORDER — FENTANYL CITRATE (PF) 100 MCG/2ML IJ SOLN
INTRAMUSCULAR | Status: DC | PRN
  Administered 2022-10-16: 100 ug via INTRAVENOUS

## 2022-10-16 MED ORDER — ONDANSETRON HCL 4 MG/2ML IJ SOLN: 4.0000 mg | Freq: Once | INTRAMUSCULAR | Status: DC | PRN

## 2022-10-16 MED ORDER — ONDANSETRON HCL 4 MG/2ML IJ SOLN
INTRAMUSCULAR | Status: DC | PRN
  Administered 2022-10-16: 4 mg via INTRAVENOUS

## 2022-10-16 MED ORDER — OXYCODONE HCL 5 MG/5ML PO SOLN: 5.0000 mg | Freq: Once | ORAL | Status: DC | PRN

## 2022-10-16 MED ORDER — OXYCODONE HCL 5 MG PO TABS: 5.0000 mg | ORAL_TABLET | Freq: Once | ORAL | Status: DC | PRN

## 2022-10-16 MED ORDER — POVIDONE-IODINE 10 % EX SOLN
CUTANEOUS | Status: DC | PRN
  Administered 2022-10-16: 1 via TOPICAL

## 2022-10-16 MED ORDER — DEXAMETHASONE SODIUM PHOSPHATE 10 MG/ML IJ SOLN
INTRAMUSCULAR | Status: DC | PRN
  Administered 2022-10-16: 10 mg via INTRAVENOUS

## 2022-10-16 MED ORDER — SUGAMMADEX SODIUM 200 MG/2ML IV SOLN
INTRAVENOUS | Status: DC | PRN
  Administered 2022-10-16: 200 mg via INTRAVENOUS

## 2022-10-16 MED ORDER — ORAL CARE MOUTH RINSE: 15.0000 mL | Freq: Once | OROMUCOSAL | Status: AC

## 2022-10-16 MED ORDER — CEFAZOLIN SODIUM-DEXTROSE 2-4 GM/100ML-% IV SOLN
INTRAVENOUS | Status: AC
  Filled 2022-10-16: qty 100

## 2022-10-16 MED ORDER — CHLORHEXIDINE GLUCONATE 4 % EX SOLN: 60.0000 mL | Freq: Once | CUTANEOUS | Status: DC

## 2022-10-16 MED ORDER — AMISULPRIDE (ANTIEMETIC) 5 MG/2ML IV SOLN: 10.0000 mg | Freq: Once | INTRAVENOUS | Status: DC | PRN

## 2022-10-16 MED ORDER — ROPIVACAINE HCL 5 MG/ML IJ SOLN
INTRAMUSCULAR | Status: DC | PRN
  Administered 2022-10-16: 30 mL via PERINEURAL

## 2022-10-16 MED ORDER — MIDAZOLAM HCL 2 MG/2ML IJ SOLN
INTRAMUSCULAR | Status: AC
  Filled 2022-10-16: qty 2

## 2022-10-16 MED ORDER — ACETAMINOPHEN 10 MG/ML IV SOLN
INTRAVENOUS | Status: DC | PRN
  Administered 2022-10-16: 1000 mg via INTRAVENOUS

## 2022-10-16 MED ORDER — PROPOFOL 10 MG/ML IV BOLUS
INTRAVENOUS | Status: AC
  Filled 2022-10-16: qty 20

## 2022-10-16 MED ORDER — ROCURONIUM BROMIDE 10 MG/ML (PF) SYRINGE
PREFILLED_SYRINGE | INTRAVENOUS | Status: DC | PRN
  Administered 2022-10-16: 70 mg via INTRAVENOUS

## 2022-10-16 MED ORDER — ONDANSETRON HCL 4 MG/2ML IJ SOLN
INTRAMUSCULAR | Status: AC
  Filled 2022-10-16: qty 2

## 2022-10-16 MED ORDER — LIDOCAINE HCL (PF) 2 % IJ SOLN
INTRAMUSCULAR | Status: AC
  Filled 2022-10-16: qty 5

## 2022-10-16 MED ORDER — DEXAMETHASONE SODIUM PHOSPHATE 10 MG/ML IJ SOLN
INTRAMUSCULAR | Status: DC | PRN
  Administered 2022-10-16: 10 mg

## 2022-10-16 MED ORDER — DEXAMETHASONE SODIUM PHOSPHATE 10 MG/ML IJ SOLN
INTRAMUSCULAR | Status: AC
  Filled 2022-10-16: qty 1

## 2022-10-16 MED ORDER — ROCURONIUM BROMIDE 10 MG/ML (PF) SYRINGE
PREFILLED_SYRINGE | INTRAVENOUS | Status: AC
  Filled 2022-10-16: qty 10

## 2022-10-16 MED ORDER — MIDAZOLAM HCL 2 MG/2ML IJ SOLN
1.0000 mg | Freq: Once | INTRAMUSCULAR | Status: AC
  Administered 2022-10-16: 2 mg via INTRAVENOUS

## 2022-10-16 MED ORDER — VANCOMYCIN HCL 1000 MG IV SOLR
INTRAVENOUS | Status: AC
  Filled 2022-10-16: qty 20

## 2022-10-16 MED ORDER — FENTANYL CITRATE PF 50 MCG/ML IJ SOSY
50.0000 ug | PREFILLED_SYRINGE | Freq: Once | INTRAMUSCULAR | Status: AC
  Administered 2022-10-16: 50 ug via INTRAVENOUS

## 2022-10-16 MED ORDER — CHLORHEXIDINE GLUCONATE 0.12 % MT SOLN
15.0000 mL | Freq: Once | OROMUCOSAL | Status: AC
  Administered 2022-10-16: 15 mL via OROMUCOSAL

## 2022-10-16 MED ORDER — FENTANYL CITRATE (PF) 100 MCG/2ML IJ SOLN
INTRAMUSCULAR | Status: AC
  Filled 2022-10-16: qty 2

## 2022-10-16 MED ORDER — PHENYLEPHRINE 80 MCG/ML (10ML) SYRINGE FOR IV PUSH (FOR BLOOD PRESSURE SUPPORT)
PREFILLED_SYRINGE | INTRAVENOUS | Status: DC | PRN
  Administered 2022-10-16: 80 ug via INTRAVENOUS
  Administered 2022-10-16 (×2): 160 ug via INTRAVENOUS
  Administered 2022-10-16: 240 ug via INTRAVENOUS
  Administered 2022-10-16: 160 ug via INTRAVENOUS

## 2022-10-16 MED ORDER — 0.9 % SODIUM CHLORIDE (POUR BTL) OPTIME
TOPICAL | Status: DC | PRN
  Administered 2022-10-16: 1000 mL

## 2022-10-16 SURGICAL SUPPLY — 74 items
APL PRP STRL LF DISP 70% ISPRP (MISCELLANEOUS) ×1
BANDAGE ESMARK 6X9 LF (GAUZE/BANDAGES/DRESSINGS) ×1 IMPLANT
BLADE AVERAGE 25X9 (BLADE) IMPLANT
BLADE MICRO SAGITTAL (BLADE) IMPLANT
BLADE SURG 15 STRL LF DISP TIS (BLADE) ×2 IMPLANT
BLADE SURG 15 STRL SS (BLADE)
BNDG CMPR 5X4 CHSV STRCH STRL (GAUZE/BANDAGES/DRESSINGS) ×1
BNDG CMPR 5X4 KNIT ELC UNQ LF (GAUZE/BANDAGES/DRESSINGS) ×2
BNDG CMPR 5X62 HK CLSR LF (GAUZE/BANDAGES/DRESSINGS) ×1
BNDG CMPR 9X6 STRL LF SNTH (GAUZE/BANDAGES/DRESSINGS) ×1
BNDG COHESIVE 4X5 TAN STRL LF (GAUZE/BANDAGES/DRESSINGS) ×1 IMPLANT
BNDG ELASTIC 4INX 5YD STR LF (GAUZE/BANDAGES/DRESSINGS) ×3 IMPLANT
BNDG ELASTIC 6INX 5YD STR LF (GAUZE/BANDAGES/DRESSINGS) ×1 IMPLANT
BNDG ESMARK 6X9 LF (GAUZE/BANDAGES/DRESSINGS) ×1
BNDG GAUZE DERMACEA FLUFF 4 (GAUZE/BANDAGES/DRESSINGS) ×1 IMPLANT
BNDG GZE DERMACEA 4 6PLY (GAUZE/BANDAGES/DRESSINGS) ×1
CHLORAPREP W/TINT 26 (MISCELLANEOUS) ×1 IMPLANT
CUFF TOURN SGL QUICK 34 (TOURNIQUET CUFF) ×1
CUFF TRNQT CYL 34X4.125X (TOURNIQUET CUFF) ×1 IMPLANT
DRAPE EXTREMITY T 121X128X90 (DISPOSABLE) ×1 IMPLANT
DRAPE IMP U-DRAPE 54X76 (DRAPES) ×1 IMPLANT
DRAPE OEC MINIVIEW 54X84 (DRAPES) IMPLANT
DRAPE U-SHAPE 47X51 STRL (DRAPES) ×1 IMPLANT
DRSG MEPITEL 4X7.2 (GAUZE/BANDAGES/DRESSINGS) ×1 IMPLANT
ELECT REM PT RETURN 15FT ADLT (MISCELLANEOUS) ×1 IMPLANT
GAUZE PAD ABD 8X10 STRL (GAUZE/BANDAGES/DRESSINGS) ×5 IMPLANT
GAUZE SPONGE 4X4 12PLY STRL (GAUZE/BANDAGES/DRESSINGS) ×1 IMPLANT
GLOVE BIOGEL PI IND STRL 8 (GLOVE) ×1 IMPLANT
GLOVE SURG SS PI 7.5 STRL IVOR (GLOVE) ×2 IMPLANT
GOWN STRL REUS W/ TWL LRG LVL3 (GOWN DISPOSABLE) ×1 IMPLANT
GOWN STRL REUS W/TWL LRG LVL3 (GOWN DISPOSABLE) ×1
KIT BASIN OR (CUSTOM PROCEDURE TRAY) ×1 IMPLANT
NDL HYPO 25X1 1.5 SAFETY (NEEDLE) IMPLANT
NDL SUT 6 .5 CRC .975X.05 MAYO (NEEDLE) IMPLANT
NEEDLE HYPO 25X1 1.5 SAFETY (NEEDLE) IMPLANT
NEEDLE MAYO TAPER (NEEDLE)
PACK ORTHO EXTREMITY (CUSTOM PROCEDURE TRAY) ×1 IMPLANT
PAD CAST 4YDX4 CTTN HI CHSV (CAST SUPPLIES) ×1 IMPLANT
PADDING CAST ABS COTTON 4X4 ST (CAST SUPPLIES) IMPLANT
PADDING CAST COTTON 4X4 STRL (CAST SUPPLIES)
PADDING CAST SYNTHETIC 4X4 STR (CAST SUPPLIES) ×3 IMPLANT
PENCIL SMOKE EVACUATOR (MISCELLANEOUS) ×1 IMPLANT
SANITIZER HAND PURELL FF 515ML (MISCELLANEOUS) ×1 IMPLANT
SHEET MEDIUM DRAPE 40X70 STRL (DRAPES) ×1 IMPLANT
SLEEVE SCD COMPRESS KNEE MED (STOCKING) ×1 IMPLANT
SPLINT FIBERGLASS 4X30 (CAST SUPPLIES) IMPLANT
SPLINT PLASTER CAST XFAST 5X30 (CAST SUPPLIES) ×20 IMPLANT
SPONGE T-LAP 18X18 ~~LOC~~+RFID (SPONGE) ×1 IMPLANT
STOCKINETTE 6  STRL (DRAPES) ×1
STOCKINETTE 6 STRL (DRAPES) ×1 IMPLANT
SUCTION FRAZIER HANDLE 10FR (MISCELLANEOUS) ×1
SUCTION TUBE FRAZIER 10FR DISP (MISCELLANEOUS) ×1 IMPLANT
SUT ETHILON 2 0 FS 18 (SUTURE) ×2 IMPLANT
SUT FIBERWIRE #2 38 T-5 BLUE (SUTURE)
SUT MNCRL AB 3-0 PS2 18 (SUTURE) ×1 IMPLANT
SUT VIC AB 0 CT1 27 (SUTURE) ×1
SUT VIC AB 0 CT1 27XBRD ANBCTR (SUTURE) IMPLANT
SUT VIC AB 0 SH 27 (SUTURE) ×1 IMPLANT
SUT VIC AB 1 CT1 27 (SUTURE) ×2
SUT VIC AB 1 CT1 27XBRD ANBCTR (SUTURE) IMPLANT
SUT VIC AB 2-0 SH 27 (SUTURE) ×2
SUT VIC AB 2-0 SH 27XBRD (SUTURE) ×1 IMPLANT
SUT VIC AB 3-0 SH 27 (SUTURE)
SUT VIC AB 3-0 SH 27X BRD (SUTURE) ×1 IMPLANT
SUTURE FIBERWR #2 38 T-5 BLUE (SUTURE) IMPLANT
SUTURE TAPE 1.3 FIBERLOP 20 ST (SUTURE) IMPLANT
SUTURETAPE 1.3 FIBERLOOP 20 ST (SUTURE)
SYR BULB EAR ULCER 3OZ GRN STR (SYRINGE) ×1 IMPLANT
SYR CONTROL 10ML LL (SYRINGE) IMPLANT
TOWEL GREEN STERILE FF (TOWEL DISPOSABLE) ×2 IMPLANT
TOWEL OR 17X24 6PK STRL BLUE (TOWEL DISPOSABLE) ×1 IMPLANT
TUBE CONNECTING 12X1/4 (SUCTIONS) ×1 IMPLANT
UNDERPAD 30X36 HEAVY ABSORB (UNDERPADS AND DIAPERS) ×1 IMPLANT
YANKAUER SUCT BULB TIP NO VENT (SUCTIONS) IMPLANT

## 2022-10-16 NOTE — Anesthesia Procedure Notes (Signed)
Procedure Name: Intubation Date/Time: 10/16/2022 4:02 PM  Performed by: Doran Clay, CRNAPre-anesthesia Checklist: Patient identified, Emergency Drugs available, Suction available, Patient being monitored and Timeout performed Patient Re-evaluated:Patient Re-evaluated prior to induction Oxygen Delivery Method: Circle system utilized Preoxygenation: Pre-oxygenation with 100% oxygen Induction Type: IV induction Ventilation: Mask ventilation without difficulty Laryngoscope Size: Mac and 4 Grade View: Grade I Tube type: Oral Tube size: 7.5 mm Number of attempts: 1 Airway Equipment and Method: Stylet Placement Confirmation: ETT inserted through vocal cords under direct vision, positive ETCO2 and breath sounds checked- equal and bilateral Secured at: 22 cm Tube secured with: Tape Dental Injury: Teeth and Oropharynx as per pre-operative assessment

## 2022-10-16 NOTE — Anesthesia Procedure Notes (Signed)
Anesthesia Regional Block: Popliteal block   Pre-Anesthetic Checklist: , timeout performed,  Correct Patient, Correct Site, Correct Laterality,  Correct Procedure, Correct Position, site marked,  Risks and benefits discussed,  Surgical consent,  Pre-op evaluation,  At surgeon's request and post-op pain management  Laterality: Right  Prep: Maximum Sterile Barrier Precautions used, chloraprep       Needles:  Injection technique: Single-shot  Needle Type: Echogenic Stimulator Needle     Needle Length: 9cm  Needle Gauge: 22     Additional Needles:   Procedures:,,,, ultrasound used (permanent image in chart),,    Narrative:  Start time: 10/16/2022 3:10 PM End time: 10/16/2022 3:15 PM Injection made incrementally with aspirations every 5 mL.  Performed by: Personally  Anesthesiologist: Lannie Fields, DO  Additional Notes: Monitors applied. No increased pain on injection. No increased resistance to injection. Injection made in 5cc increments. Good needle visualization. Patient tolerated procedure well.

## 2022-10-16 NOTE — Discharge Instructions (Addendum)
Christian Cedars, MD [facility]  Please read the following information regarding your care after surgery.  FOLLOW UP: [facility] May 20th 1:40 PM  Medications  You only need a prescription for the narcotic pain medicine (ex. oxycodone, Percocet, Norco).  All of the other medicines listed below are available over the counter. ? Aleve 2 pills twice a day for the first 3 days after surgery. ? acetominophen (Tylenol) 650 mg every 4-6 hours as you need for minor to moderate pain ? oxycodone as prescribed for severe pain  ? To help prevent blood clots, take aspirin (81 mg) twice daily for 42 days after surgery (or total duration of nonweightbearing).  You should also get up every hour while you are awake to move around.  Weight Bearing ? Do NOT bear any weight on the operated leg or foot. This means do NOT touch your surgical leg to the ground!  Cast / Splint / Dressing ? If you have a splint, do NOT remove this. Keep your splint, cast or dressing clean and dry.  Don't put anything (coat hanger, pencil, etc) down inside of it.  If it gets wet, call the office immediately to schedule an appointment for a cast change.  Swelling IMPORTANT: It is normal for you to have swelling where you had surgery. To reduce swelling and pain, keep at least 3 pillows under your leg so that your toes are above your nose and your heel is above the level of your hip.  It may be necessary to keep your foot or leg elevated for several weeks.  This is critical to helping your incisions heal and your pain to feel better.  Follow Up Call my office at [phone] when you are discharged from the hospital or surgery center to schedule an appointment to be seen 7-10 days after surgery.  Call my office at [phone] if you develop a fever >101.5 F, nausea, vomiting, bleeding from the surgical site or severe pain.

## 2022-10-16 NOTE — Transfer of Care (Signed)
Immediate Anesthesia Transfer of Care Note  Patient: Christian [name]  Procedure(s) Performed: ACHILLES TENDON REPAIR (Right)  Patient Location: PACU  Anesthesia Type:General  Level of Consciousness: sedated  Airway & Oxygen Therapy: Patient Spontanous Breathing and Patient connected to face mask oxygen  Post-op Assessment: Report given to RN and Post -op Vital signs reviewed and stable  Post vital signs: Reviewed and stable  Last Vitals:  Vitals Value Taken Time  BP 109/65 10/16/22 1728  Temp    Pulse 70 10/16/22 1729  Resp 15 10/16/22 1729  SpO2 91 % 10/16/22 1729  Vitals shown include unvalidated device data.  Last Pain:  Vitals:   10/16/22 1525  TempSrc:   PainSc: 0-No pain      Patients Stated Pain Goal: 0 (10/16/22 1433)  Complications: No notable events documented.

## 2022-10-16 NOTE — H&P (Signed)
H&P Update:  -History and Physical Reviewed  -Patient has been re-examined  -No change in the plan of care  -The risks and benefits were presented and reviewed. The risks due to rerupture, hardware/suture failure and/or irritation, new/persistent infection, stiffness, nerve/vessel/tendon injury or rerupture of repaired tendon, nonunion/malunion, allograft usage, wound healing issues, development of arthritis, failure of this surgery, possibility of external fixation with delayed definitive surgery, need for further surgery, thromboembolic events, anesthesia/medical complications, amputation, death among others were discussed. The patient acknowledged the explanation, agreed to proceed with the plan and a consent was signed.  Netta Cedars

## 2022-10-16 NOTE — Op Note (Addendum)
10/16/2022  6:31 PM   PATIENT: Christian [name]  62 y.o. male  MRN: [id number]   PRE-OPERATIVE DIAGNOSIS:   Rupture of right Achilles tendon   POST-OPERATIVE DIAGNOSIS:   Same   PROCEDURE: 1] Right Achilles tendon repair 2] Right leg release of deep posterior compartment fasciotomy   SURGEON:  Netta Cedars, MD   ASSISTANT: None   ANESTHESIA: General, regional   EBL: Minimal   TOURNIQUET:    Total Tourniquet Time Documented: Thigh (Right) - 42 minutes Total: Thigh (Right) - 42 minutes    COMPLICATIONS: None apparent   DISPOSITION: Extubated, awake and stable to recovery.   INDICATION FOR PROCEDURE: The patient presented with above diagnosis.  We discussed the diagnosis, alternative treatment options, risks and benefits of the above surgical intervention, as well as alternative non-operative treatments. All questions/concerns were addressed and the patient/family demonstrated appropriate understanding of the diagnosis, the procedure, the postoperative course, and overall prognosis. The patient wished to proceed with surgical intervention and signed an informed surgical consent as such, in each others presence prior to surgery.   PROCEDURE IN DETAIL: After preoperative consent was obtained and the correct operative site was identified, the patient was brought to the operating room supine on stretcher and transferred onto operating table. General anesthesia was induced. Preoperative antibiotics were administered. Surgical timeout was taken. The patient was then positioned supine with an ipsilateral hip bump. The operative lower extremity was prepped and draped in standard sterile fashion with a tourniquet around the thigh. The extremity was exsanguinated and the tourniquet was inflated to 275 mmHg.  A longitudinal incision was made over the palpable defect in the Achilles.  Dissection was carried sharply down through the subcutaneous tissues and peritenon  creating full-thickness flaps medially and laterally.  The Achilles tendon rupture was identified.  Hematoma was debrided.    Direct release of deep posterior compartment fasciotomy was performed.  The tendon ends were trimmed with scissors. The tendon ends were roughly approximated.  #1 Vicryl box sutures on CT-1 ineedle were placed in each tendon stump 1 cm, 2 cm and 3 cm from the rupture site.  The ankle was then maximally plantarflexed.  The tendon ends were then sequentially tied from near to far.  The Van Lear test was noted to be normal at this point.  A 3-0 Monocryl running circumferential stitch was placed around the tendon to augment the repair.  The surgical sites were thoroughly irrigated. The tourniquet was deflated and hemostasis achieved. The paratenon and deep layers were closed using 2-0 vicryl. The skin was closed without tension using 2-0 nylon suture.    The leg was cleaned with saline and sterile dressings with gauze were applied. A well padded bulky short leg splint was applied in plantarflexion. The patient was awakened from anesthesia and transported to the recovery room in stable condition.    FOLLOW UP PLAN: -transfer to PACU, then home -strict NWB operative extremity, maximum elevation -maintain short leg splint until follow up -DVT ppx: Aspirin 81 mg twice daily while NWB -follow up as outpatient Mon 10/21/22 in [place] at 1:40 PM for wound check with exchange of short leg splint to short leg cast -sutures out in 2-3 weeks in outpatient office   RADIOGRAPHS: None utilized   [employer] [facility]

## 2022-10-17 ENCOUNTER — Encounter (HOSPITAL_COMMUNITY): Payer: Self-pay | Admitting: Orthopaedic Surgery

## 2022-10-17 NOTE — Anesthesia Postprocedure Evaluation (Signed)
Anesthesia Post Note  Patient: Christian [name]  Procedure(s) Performed: ACHILLES TENDON REPAIR (Right)     Patient location during evaluation: PACU Anesthesia Type: Regional and General Level of consciousness: awake and alert, oriented and patient cooperative Pain management: pain level controlled Vital Signs Assessment: post-procedure vital signs reviewed and stable Respiratory status: spontaneous breathing, nonlabored ventilation and respiratory function stable Cardiovascular status: blood pressure returned to baseline and stable Postop Assessment: no apparent nausea or vomiting Anesthetic complications: no   No notable events documented.  Last Vitals:  Vitals:   10/16/22 1825 10/16/22 1900  BP: 119/77 118/76  Pulse: 74 61  Resp: 16 16  Temp: 36.5 C 36.7 C  SpO2: 93% 96%    Last Pain:  Vitals:   10/16/22 1900  TempSrc:   PainSc: 0-No pain                 Lannie Fields
